# Patient Record
Sex: Female | Born: 1985 | Race: White | Hispanic: No | Marital: Single | Smoking: Never smoker
Health system: Southern US, Community
[De-identification: ages and names within clinical notes are randomized; demographics above are authoritative.]

---

## 2009-07-29 ENCOUNTER — Emergency Department (HOSPITAL_BASED_OUTPATIENT_CLINIC_OR_DEPARTMENT_OTHER): Admission: EM | Admit: 2009-07-29 | Discharge: 2009-07-29 | Payer: Self-pay | Admitting: Emergency Medicine

## 2009-09-19 ENCOUNTER — Emergency Department (HOSPITAL_BASED_OUTPATIENT_CLINIC_OR_DEPARTMENT_OTHER): Admission: EM | Admit: 2009-09-19 | Discharge: 2009-09-19 | Payer: Self-pay | Admitting: Emergency Medicine

## 2009-09-22 ENCOUNTER — Emergency Department (HOSPITAL_BASED_OUTPATIENT_CLINIC_OR_DEPARTMENT_OTHER): Admission: EM | Admit: 2009-09-22 | Discharge: 2009-09-22 | Payer: Self-pay | Admitting: Emergency Medicine

## 2010-01-04 ENCOUNTER — Emergency Department (HOSPITAL_BASED_OUTPATIENT_CLINIC_OR_DEPARTMENT_OTHER): Admission: EM | Admit: 2010-01-04 | Discharge: 2010-01-04 | Payer: Self-pay | Admitting: Emergency Medicine

## 2010-07-09 ENCOUNTER — Ambulatory Visit: Payer: Self-pay | Admitting: Diagnostic Radiology

## 2010-07-09 ENCOUNTER — Ambulatory Visit (HOSPITAL_BASED_OUTPATIENT_CLINIC_OR_DEPARTMENT_OTHER): Admission: RE | Admit: 2010-07-09 | Discharge: 2010-07-09 | Payer: Self-pay | Admitting: Family Medicine

## 2011-02-18 LAB — URINALYSIS, ROUTINE W REFLEX MICROSCOPIC
Bilirubin Urine: NEGATIVE
Hgb urine dipstick: NEGATIVE
Protein, ur: NEGATIVE mg/dL
pH: 5.5 (ref 5.0–8.0)

## 2011-02-18 LAB — URINE MICROSCOPIC-ADD ON

## 2011-08-08 IMAGING — CT CT ABD-PELV W/ CM
2 of 3 series · 16 of 46 positions shown, 18 images · IV contrast (agent unspecified)
Comparison: None.

CLINICAL DATA: Periumbilical pain for 10 days with lifting and
coughing.  Question hernia.

CT ABDOMEN AND PELVIS WITH CONTRAST
TECHNIQUE: Multidetector CT imaging of the abdomen and pelvis was
performed following the standard protocol during bolus
administration of intravenous contrast.
Contrast: 100 ml [id number] intravenously.

[Series 2: abd/pelvis 5.0 b31f · axial · 0.68mm/px · z∈[-622,-236]mm · 13 of 89 slices shown, 15 images]
[im 6/89  soft-tissue]
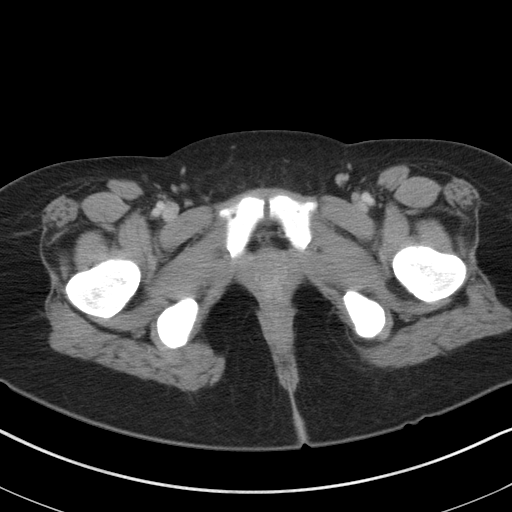
[im 6/89  bone]
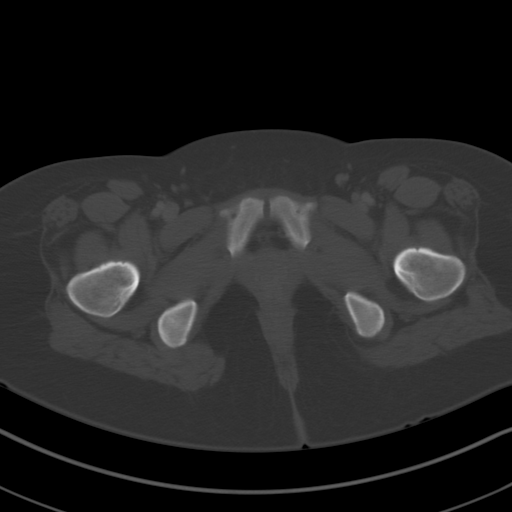
[im 12/89  soft-tissue]
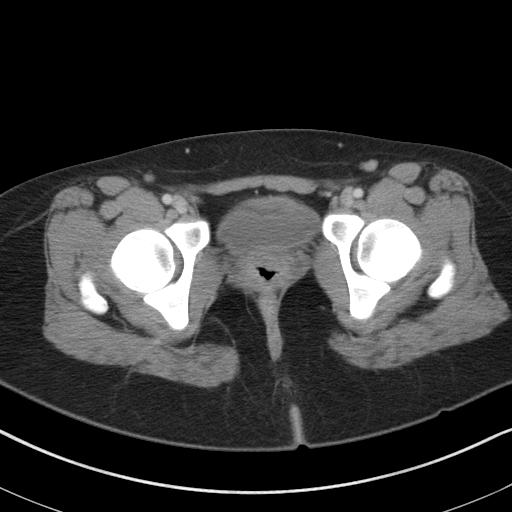
[im 18/89  soft-tissue]
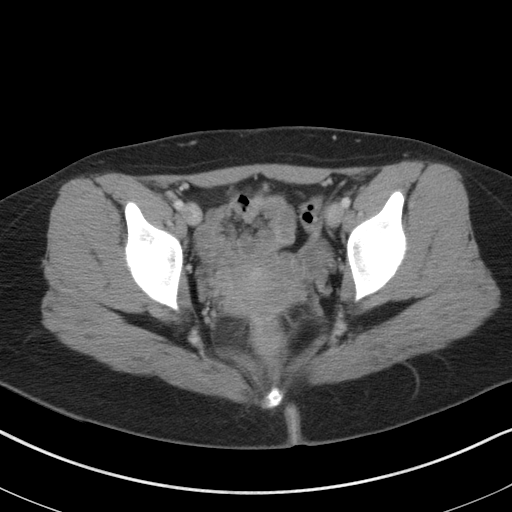
[im 26/89  soft-tissue]
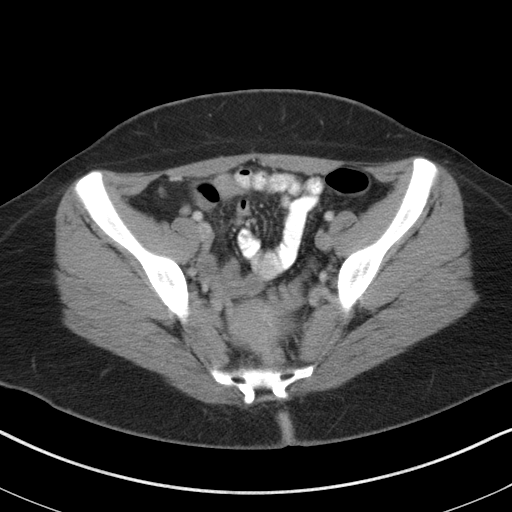
[im 32/89  soft-tissue]
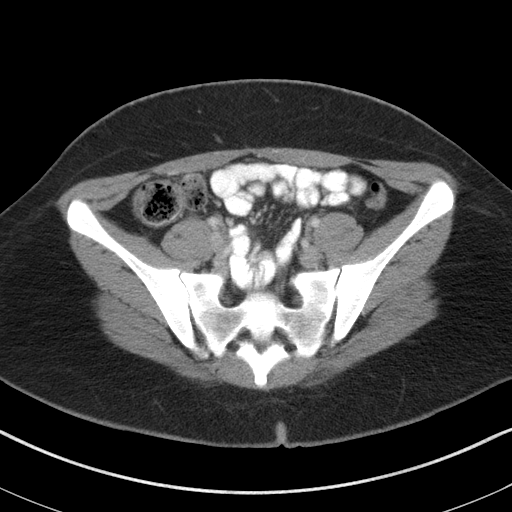
[im 37/89  soft-tissue]
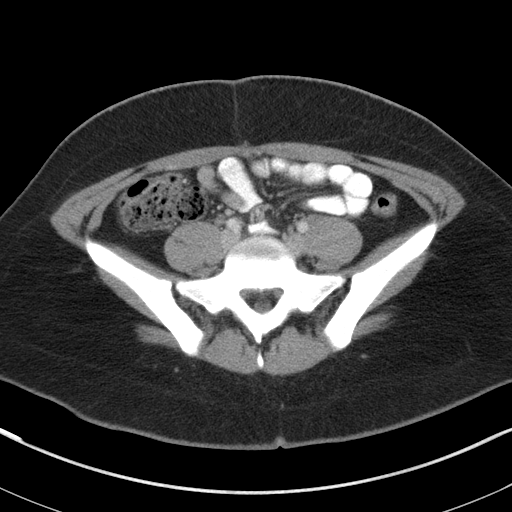
[im 46/89  soft-tissue]
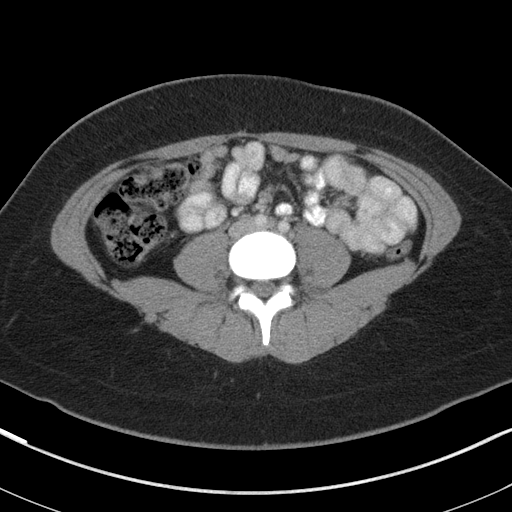
[im 52/89  soft-tissue]
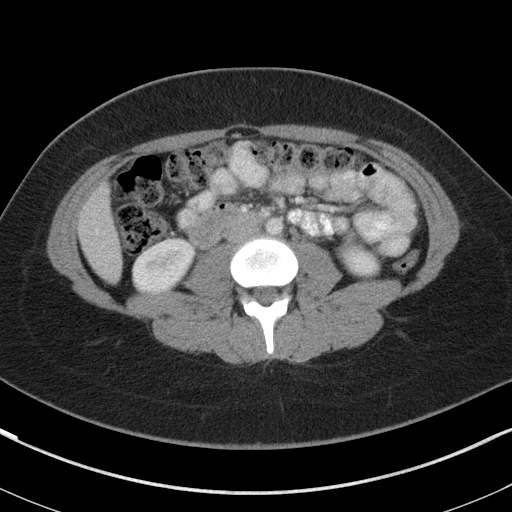
[im 57/89  soft-tissue]
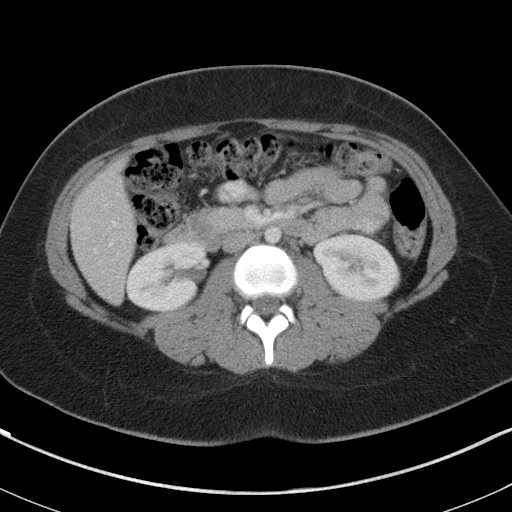
[im 57/89  bone]
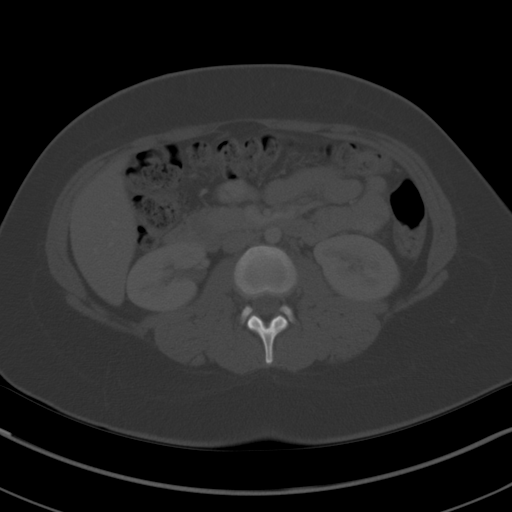
[im 63/89  soft-tissue]
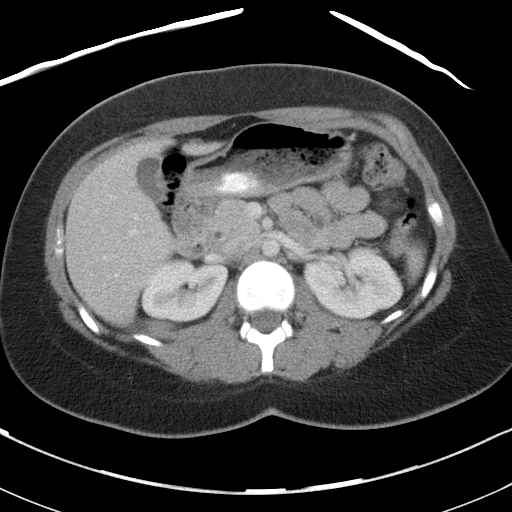
[im 71/89  soft-tissue]
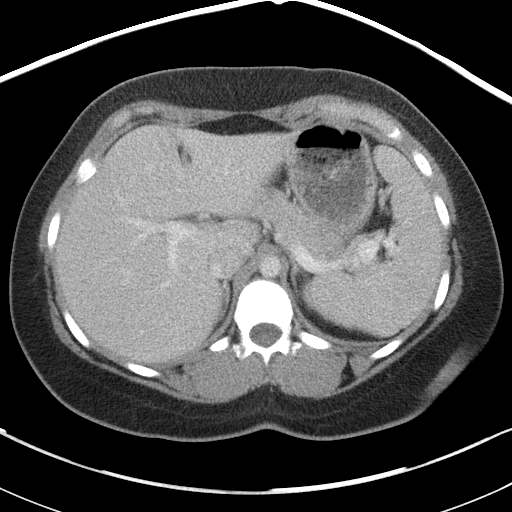
[im 77/89  soft-tissue]
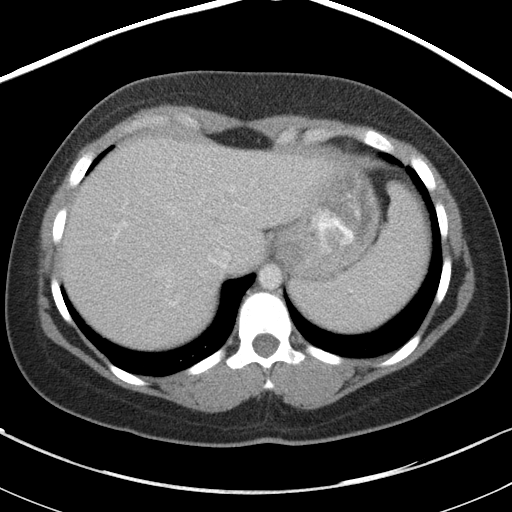
[im 83/89  soft-tissue]
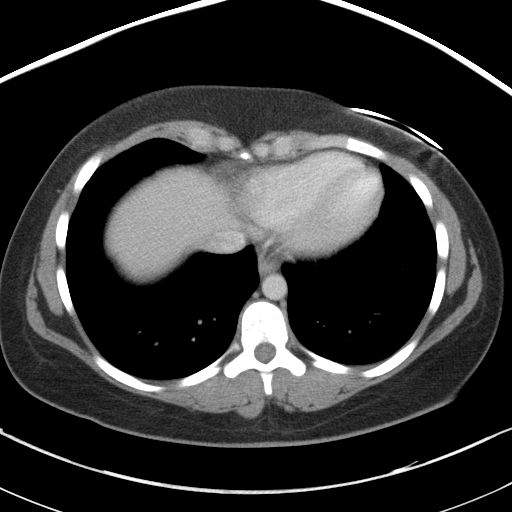

[Series 5: abd/pelvis 3.0 coronal · coronal · 0.62mm/px · 3 of 97 slices shown]
[im 33/97  soft-tissue]
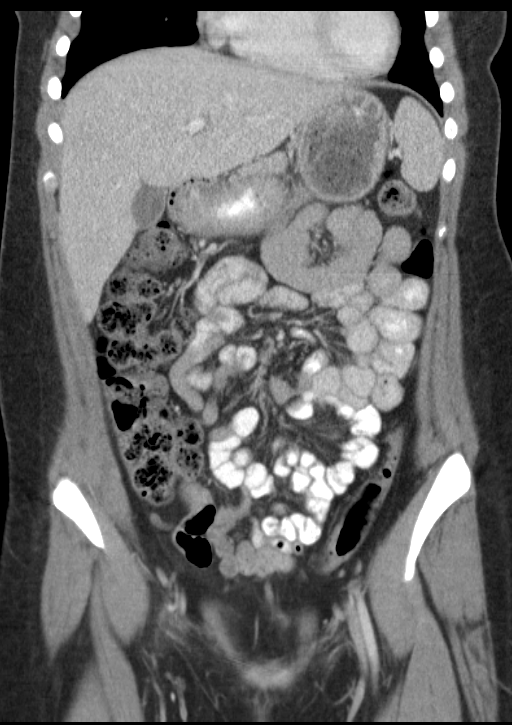
[im 43/97  soft-tissue]
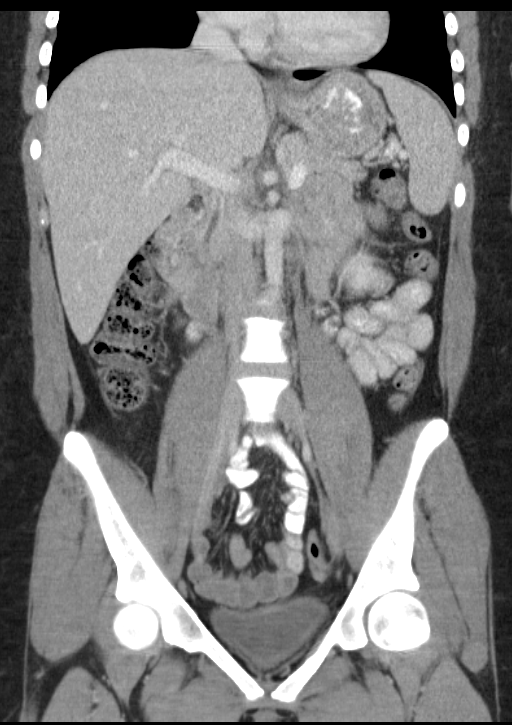
[im 54/97  soft-tissue]
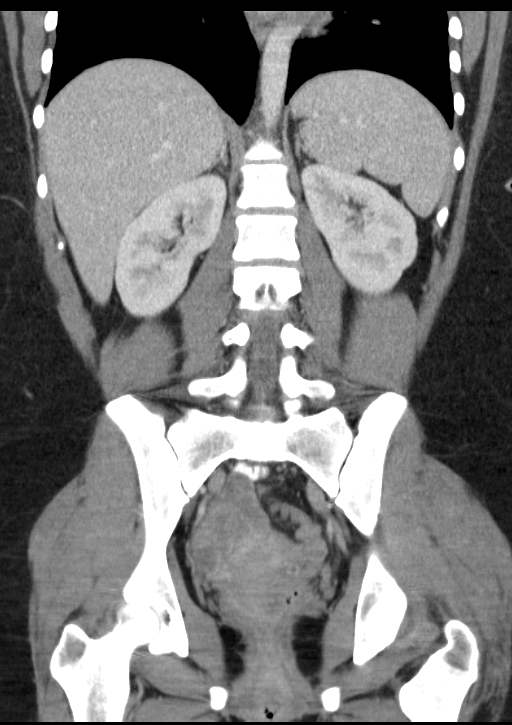

[16 of 46 positions shown; findings below may reference images not displayed]

FINDINGS: Study was performed with the patient at rest (no
Valsalva).  The anterior abdominal wall appears normal with
symmetric musculature.  There is no evidence of hernia or
periumbilical inflammatory change.

The lung bases are clear.  There is no pleural effusion.  The
liver, spleen, gallbladder, pancreas, adrenal glands and kidneys
appear normal.

There is moderate stool throughout the colon.  The appendix and
terminal ileum appear normal.  Small lymph nodes are present within
the ileocolonic mesentery in the right lower quadrant.  There is no
surrounding inflammatory change.

The uterus is retroflexed.  There is no evidence of adnexal mass.
Some of the small bowel loops in the right adnexa are not opacified
with enteric contrast.
IMPRESSION: 1.  No evidence of abdominal wall hernia.
2.  No abdominal pelvic inflammatory changes are identified.  The
appendix appears normal.  Small ileocolonic mesenteric lymph nodes
are not uncommonly seen in patients of this age.
3.  Moderate stool throughout the colon.

## 2012-06-29 ENCOUNTER — Encounter (HOSPITAL_COMMUNITY): Payer: Self-pay | Admitting: Psychiatry

## 2012-06-29 ENCOUNTER — Ambulatory Visit (INDEPENDENT_AMBULATORY_CARE_PROVIDER_SITE_OTHER): Payer: Medicaid Other | Admitting: Psychiatry

## 2012-06-29 VITALS — BP 129/83 | HR 86 | Wt 184.2 lb

## 2012-06-29 DIAGNOSIS — F419 Anxiety disorder, unspecified: Secondary | ICD-10-CM

## 2012-06-29 DIAGNOSIS — F4322 Adjustment disorder with anxiety: Secondary | ICD-10-CM

## 2012-06-29 MED ORDER — GABAPENTIN 300 MG PO CAPS: ORAL_CAPSULE | ORAL | Status: AC
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
## 2023-02-22 NOTE — Telephone Encounter (Signed)
Opened in error
